# Patient Record
Sex: Female | Born: 1990 | Race: White | Hispanic: No | Marital: Single
Health system: Southern US, Community
[De-identification: ages and names within clinical notes are randomized; demographics above are authoritative.]

---
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
## 2008-04-25 IMAGING — CT CT HEAD WITHOUT CONTRAST
2 series · 16 of 30 positions shown, 20 images · non-contrast
Comparison: none

REASON FOR EXAM: HA
COMMENTS:

[Series 2: without · axial · non-contrast · 0.42mm/px · z∈[+404,+524]mm · 13 of 29 slices shown, 17 images]
[im 3/29  brain]
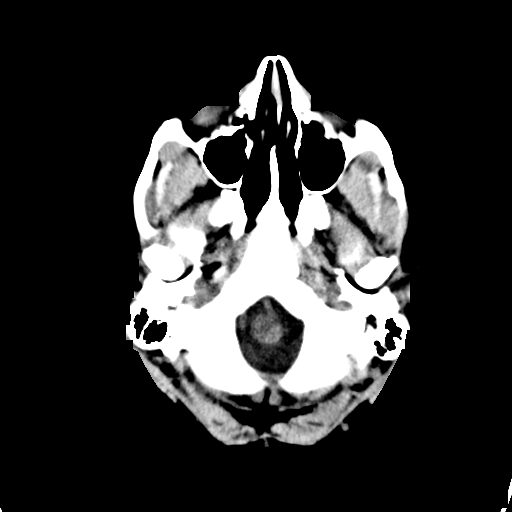
[im 3/29  bone]
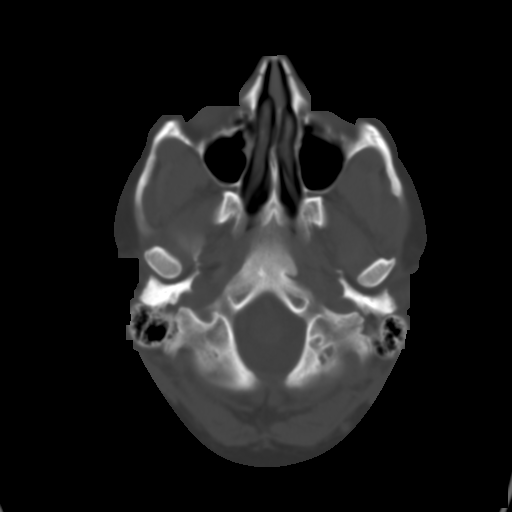
[im 5/29  brain]
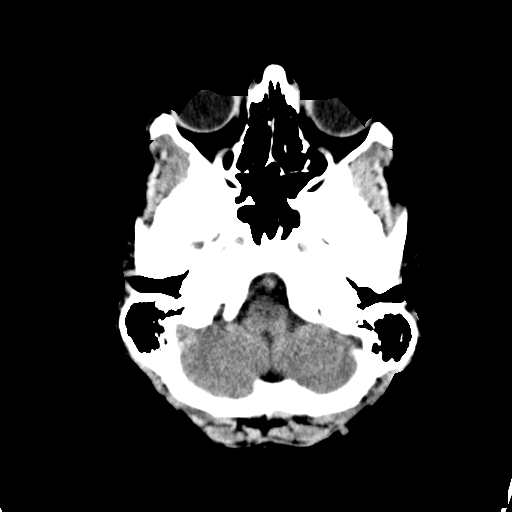
[im 7/29  brain]
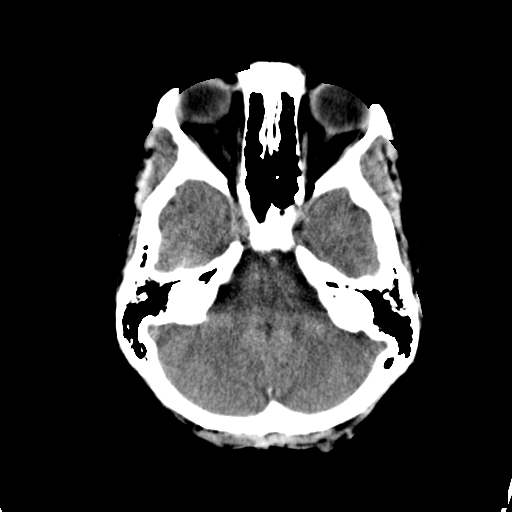
[im 9/29  brain]
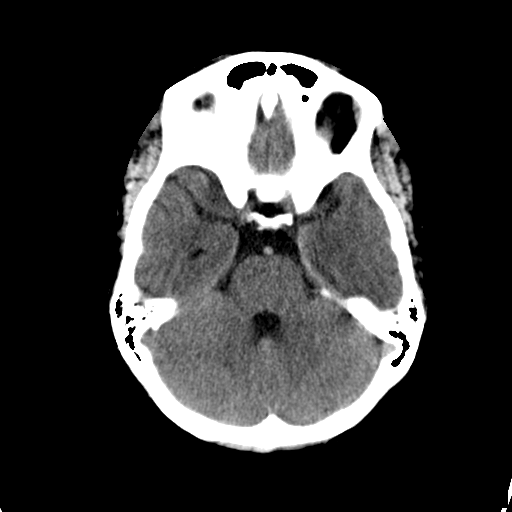
[im 11/29  brain]
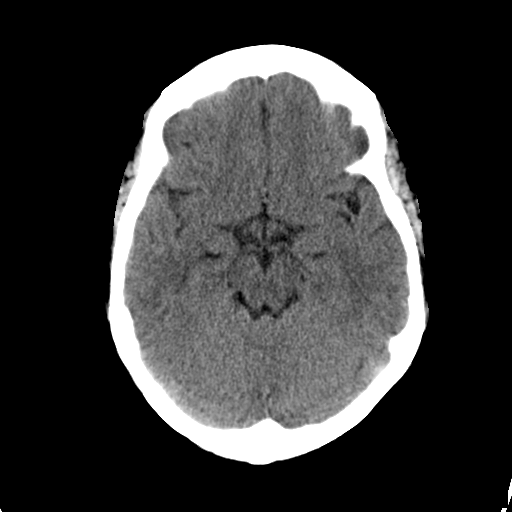
[im 11/29  bone]
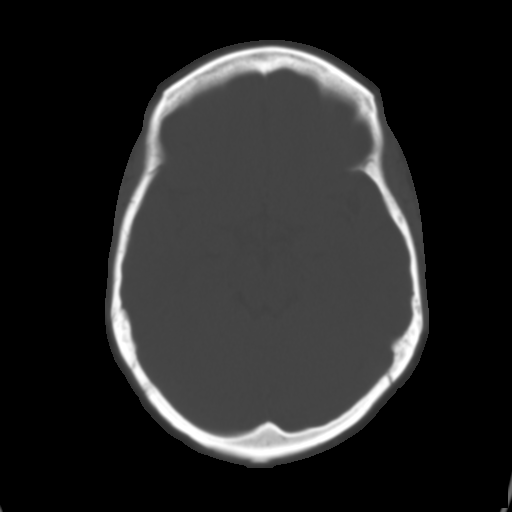
[im 13/29  brain]
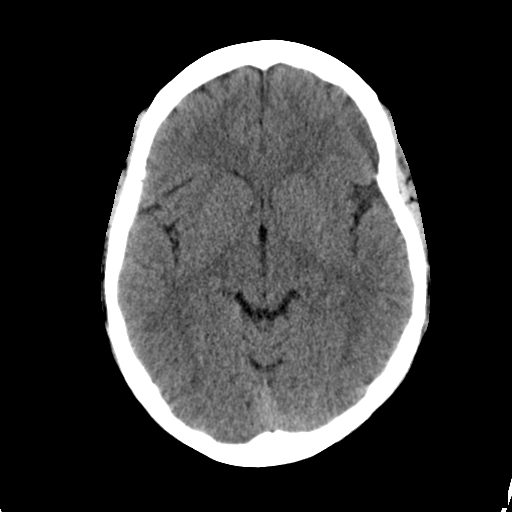
[im 15/29  brain]
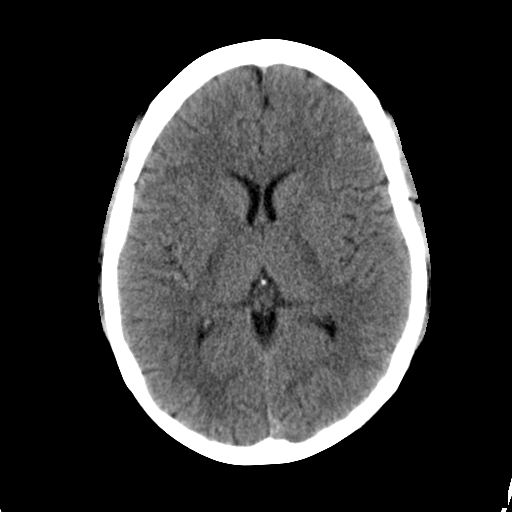
[im 17/29  brain]
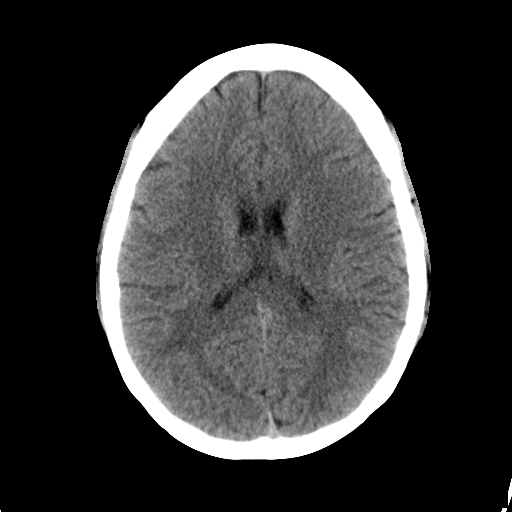
[im 19/29  brain]
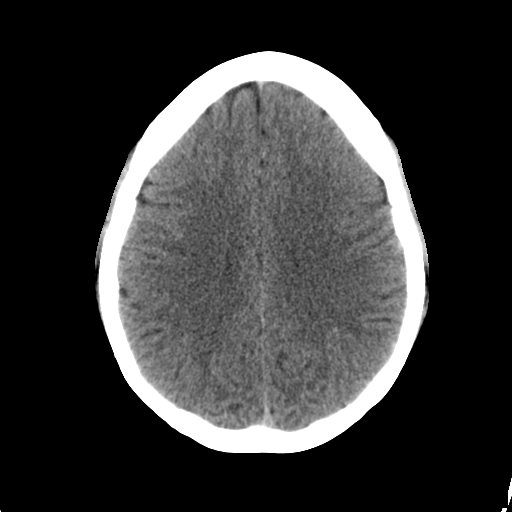
[im 19/29  bone]
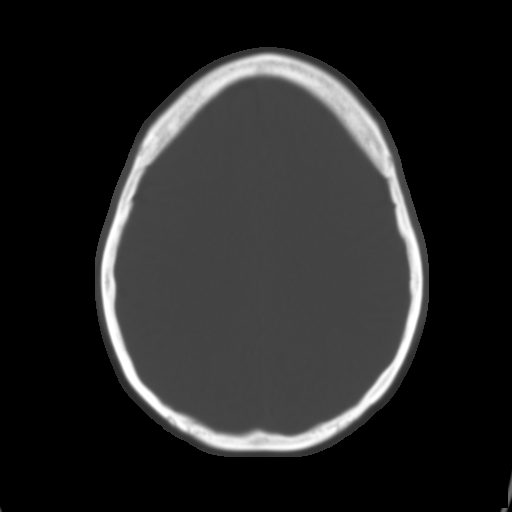
[im 21/29  brain]
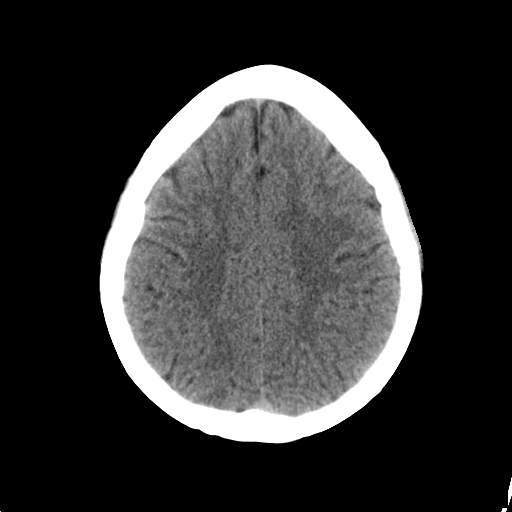
[im 23/29  brain]
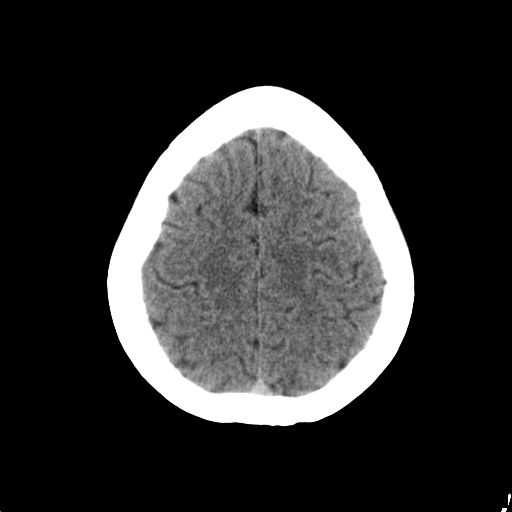
[im 25/29  brain]
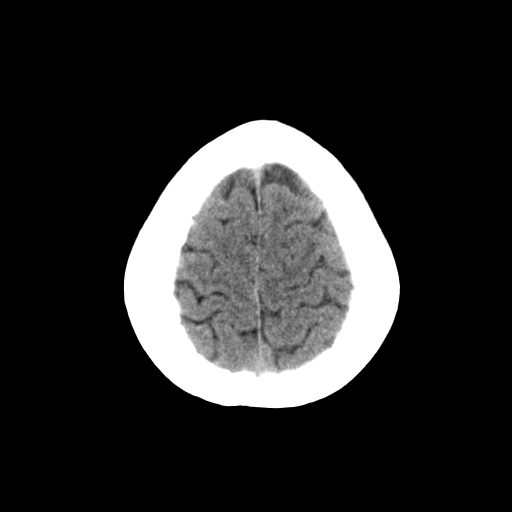
[im 27/29  brain]
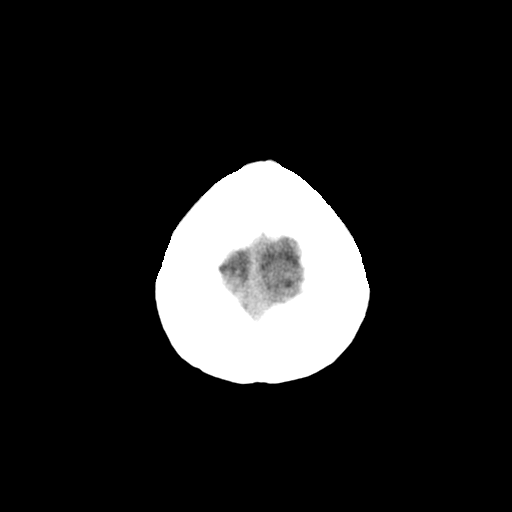
[im 27/29  bone]
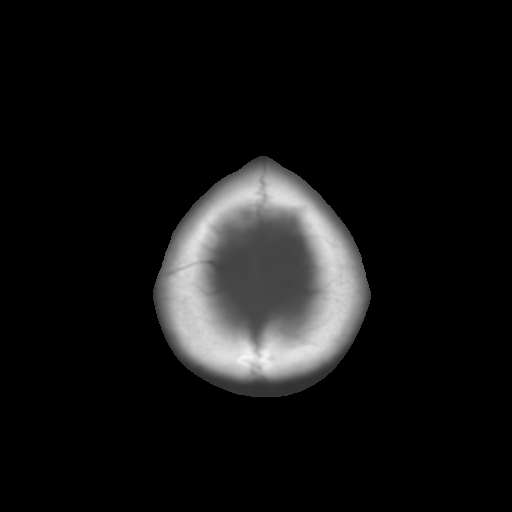

[Series 3: bone · axial · 0.42mm/px · z∈[+404,+444]mm · 3 of 29 slices shown]
[im 3/29  bone]
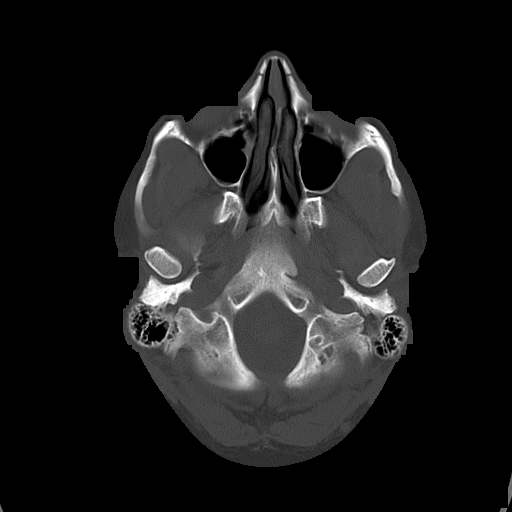
[im 7/29  bone]
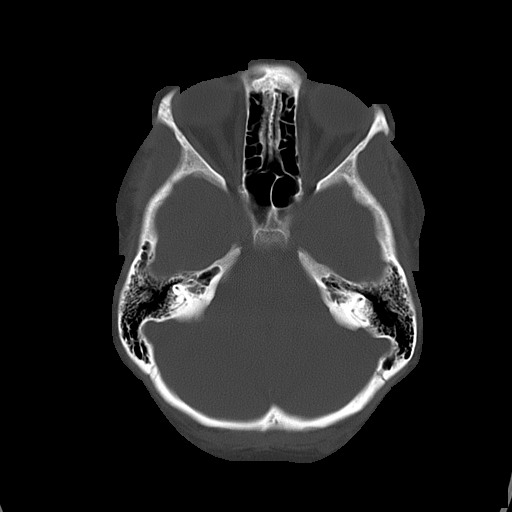
[im 11/29  bone]
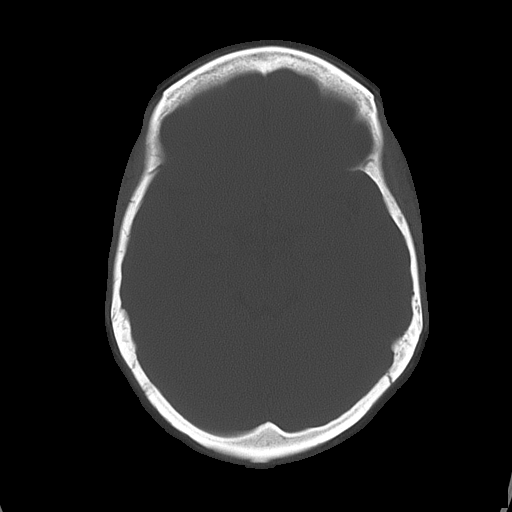

[16 of 30 positions shown; findings below may reference images not displayed]

PROCEDURE:     CT  - CT HEAD WITHOUT CONTRAST  - August 21, 2007  [DATE]

RESULT:     The ventricles are normal in size and position. There is no
intracranial hemorrhage, mass, or mass effect. At bone window settings, the
observed portions of the paranasal sinuses are clear. No lytic or blastic
bony lesions are identified.
IMPRESSION: I do not see evidence of an acute intracranial abnormality.

Further evaluation with MRI is available if the patient's symptoms warrant
this.

A preliminary report was sent to the [HOSPITAL] the conclusion
of the study.

## 2011-05-10 NOTE — Group Therapy Note (Signed)
[name] [name], [name]               ACCOUNT NO.:  [id number]   MEDICAL RECORD NO.:  [id number]          PATIENT TYPE:  WOC   LOCATION:  [facility]                   FACILITY:  [facility]   PHYSICIAN:  Tinnie Gens, MD        DATE OF BIRTH:  09/18/1991   DATE OF SERVICE:  10/17/2007                                  CLINIC NOTE   CHIEF COMPLAINT:  Genital warts.   HISTORY OF PRESENT ILLNESS:  This patient is an 20 year old gravida 2,  para 1-0-1-1 who has a history of genital warts.  She was previously  seen at [facility] in [place], she was treated with TCA  back in February of this year for several mom treatments however, she  failed to get significant resolution.  The patient was then scheduled  for laser removal of her condyloma in the OR, however at her preop visit  she was noted to have dangerous the low platelets.  At that time she was  diagnosed with ITP, given rounds of steroids and referred to  Hematology/Oncology.  Apparently she failed to keep her appointment at  [facility] for this problem and now she comes in  today for further treatment.  The patient reports she did some  regression of her warts with TCA treatment, just not enough.  The  patient has never tried Aldara cream.  The patient reports a Pap smear a  few months ago at the [facility], however I do not have records of  this.  The patient denies ever receiving Gardasil.   PAST MEDICAL HISTORY:  Significant for ITP.   PAST SURGICAL HISTORY:  Negative.   OBSTETRICAL HISTORY:  G2, P1, one vaginal delivery.   GYN HISTORY:  Menarche at age 46, cycles monthly every 3-4 weeks.  She  is not on any birth control.   FAMILY HISTORY:  Negative.   SOCIAL HISTORY:  She does smoke one pack per day for the last 3 years.  No IV drug use or alcohol use.   A 14-point review of systems is reviewed, is negative.   EXAM:  Vitals are as in the chart.  She is a well-developed, well-  nourished  female in no acute distress.  ABDOMEN:  Soft, nontender, nondistended.  GU:  The external genitalia are peppered with condyloma from the mons  pubis to the perianal region, both sides are effected.  There are at  least more than 20.  There are several different locations.  I do not  think they would be very amenable to TCA nor laser, suspect this would  be better treated with Aldara cream.  BUS is normal.  Vagina is pink and  rugated.  Cervix parous without lesion.  Uterus small, anteverted.  No  adnexal mass or tenderness.   IMPRESSION:  1. Condyloma acuminata.  2. History of idiopathic thrombocytopenic purpura, question resolved.      Her last blood count was 280 however I am not sure if she was on      steroids at that time.  3. High-risk for sexually transmitted diseases.  4. Birth control  counseling.   PLAN:  1. Will get the patient some birth control pills.  2. Aldara to apply to affected area 3 times weekly, wash off in the      morning for at least 6 weeks.  3. Gardasil vaccine today.  4. Followup in 6 weeks for a repeat Gardasil and recheck of her warts      and see how she is doing with the medication.           ______________________________  Tinnie Gens, MD     TP/MEDQ  D:  10/17/2007  T:  10/18/2007  Job:  [id number]

## 2011-10-05 LAB — POCT PREGNANCY, URINE: Operator id: 148111

## 2017-01-30 ENCOUNTER — Encounter: Payer: Self-pay | Admitting: Emergency Medicine

## 2017-01-30 ENCOUNTER — Emergency Department
Admission: EM | Admit: 2017-01-30 | Discharge: 2017-01-30 | Disposition: A | Discharge status: Home / Self Care | Payer: Self-pay | Attending: Emergency Medicine | Admitting: Emergency Medicine

## 2017-01-30 DIAGNOSIS — J069 Acute upper respiratory infection, unspecified: Secondary | ICD-10-CM | POA: Insufficient documentation

## 2017-01-30 MED ORDER — PSEUDOEPH-BROMPHEN-DM 30-2-10 MG/5ML PO SYRP: 5.0000 mL | ORAL_SOLUTION | Freq: Four times a day (QID) | ORAL | 0 refills | Status: AC | PRN

## 2017-01-30 NOTE — ED Triage Notes (Signed)
Pt reports cough and runny nose for three to four days. Pt states she needs a note to be able to return to work.

## 2017-01-30 NOTE — ED Notes (Signed)
See triage note  Has had cough with runny nose for about 4 days   Afebrile on arrival to ED

## 2017-01-30 NOTE — ED Provider Notes (Signed)
[facility] Emergency Department Provider Note   ____________________________________________   First MD Initiated Contact with Patient 01/30/17 1623     (approximate)  I have reviewed the triage vital signs and the nursing notes.   HISTORY  Chief Complaint Cough    HPI [name] [name] is a 26 y.o. female patient complaining of cough and runny nose 3-4 days. Patient state her job requires her to have a note return back to work. 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 Patient said her cough intermitting productive and nonproductive. States cough at this time is productive. Patient denies any fever associated this complaint. Patient has not taken a flu shot this season. Patient denies generalized body aches, nausea, vomiting, or diarrhea. No palliative measures taken for her complaint. Patient is afebrile.  History reviewed. No pertinent past medical history.  There are no active problems to display for this patient.   No past surgical history on file.  Prior to Admission medications   Medication Sig Start Date End Date Taking? Authorizing Provider  brompheniramine-pseudoephedrine-DM 30-2-10 MG/5ML syrup Take 5 mLs by mouth 4 (four) times daily as needed. 01/30/17   Joni Reining, PA-C    Allergies Patient has no known allergies.  No family history on file.  Social History Social History  Substance Use Topics  . Smoking status: Not on file  . Smokeless tobacco: Not on file  . Alcohol use Not on file    Review of Systems Constitutional: No fever/chills Eyes: No visual changes. RUE:AVWUJ congestion intermittently runny nose.  Cardiovascular: Denies chest pain. Respiratory: Denies shortness of breath. Dr. cough Gastrointestinal: No abdominal pain.  No nausea, no vomiting.  No diarrhea.  No constipation. Genitourinary: Negative for dysuria. Musculoskeletal: Negative for back pain. Skin: Negative for rash. Neurological: Negative for  headaches, focal weakness or numbness.   ____________________________________________   PHYSICAL EXAM:  VITAL SIGNS: ED Triage Vitals  Enc Vitals Group     BP 01/30/17 1554 113/63     Pulse Rate 01/30/17 1554 93     Resp 01/30/17 1554 18     Temp 01/30/17 1554 98.6 F (37 C)     Temp Source 01/30/17 1554 Oral     SpO2 01/30/17 1554 99 %     Weight 01/30/17 1556 170 lb (77.1 kg)     Height 01/30/17 1556 5' (1.524 m)     Head Circumference --      Peak Flow --      Pain Score --      Pain Loc --      Pain Edu? --      Excl. in GC? --     Constitutional: Alert and oriented. Well appearing and in no acute distress. Eyes: Conjunctivae are normal. PERRL. EOMI. Head: Atraumatic. Nose:Edematous nasal turbinates clear rhinorrhea. Mouth/Throat: Mucous membranes are moist.  Oropharynx non-erythematous. Neck: No stridor.  No cervical spine tenderness to palpation. Hematological/Lymphatic/Immunilogical: No cervical lymphadenopathy. Cardiovascular: Normal rate, regular rhythm. Grossly normal heart sounds.  Good peripheral circulation. Respiratory: Normal respiratory effort.  No retractions. Lungs CTAB. Productive cough. Gastrointestinal: Soft and nontender. No distention. No abdominal bruits. No CVA tenderness. Musculoskeletal: No lower extremity tenderness nor edema.  No joint effusions. Neurologic:  Normal speech and language. No gross focal neurologic deficits are appreciated. No gait instability. Skin:  Skin is warm, dry and intact. No rash noted. Psychiatric: Mood and affect are normal. Speech and behavior are normal.  ____________________________________________   LABS (all labs ordered are  listed, but only abnormal results are displayed)  Labs Reviewed - No data to display ____________________________________________  EKG   ____________________________________________  RADIOLOGY   ____________________________________________   PROCEDURES  Procedure(s)  performed: None  Procedures  Critical Care performed: No  ____________________________________________   INITIAL IMPRESSION / ASSESSMENT AND PLAN / ED COURSE  Pertinent labs & imaging results that were available during my care of the patient were reviewed by me and considered in my medical decision making (see chart for details).  Upper respiratory infection. Patient given discharge care instructions. Patient given a work note. Patient given a prescription for Bromfed-DM. Patient advised [facility].      ____________________________________________   FINAL CLINICAL IMPRESSION(S) / ED DIAGNOSES  Final diagnoses:  Upper respiratory tract infection, unspecified type      NEW MEDICATIONS STARTED DURING THIS VISIT:  New Prescriptions   BROMPHENIRAMINE-PSEUDOEPHEDRINE-DM 30-2-10 MG/5ML SYRUP    Take 5 mLs by mouth 4 (four) times daily as needed.     Note:  This document was prepared using Dragon voice recognition software and may include unintentional dictation errors.    Joni Reiningonald K Dilraj Killgore, PA-C 01/30/17 1638    Emily FilbertJonathan E Williams, MD 01/31/17 [phone]

## 2017-03-20 ENCOUNTER — Ambulatory Visit: Payer: Self-pay

## 2017-04-10 ENCOUNTER — Ambulatory Visit: Payer: Self-pay | Admitting: Pharmacy Technician

## 2017-04-10 NOTE — Progress Notes (Signed)
Patient scheduled for eligibility appointment at [facility].  Patient did not show for the appointment on April 10, 2017 at 2:00p.m.  Patient did not reschedule eligibility appointment.  [facility] unable to provide additional medication assistance until eligibility is determined.  Sherilyn Dacosta Care Manager [facility]

## 2022-11-25 DIAGNOSIS — Z419 Encounter for procedure for purposes other than remedying health state, unspecified: Secondary | ICD-10-CM | POA: Diagnosis not present

## 2022-12-26 DIAGNOSIS — Z419 Encounter for procedure for purposes other than remedying health state, unspecified: Secondary | ICD-10-CM | POA: Diagnosis not present

## 2023-01-26 DIAGNOSIS — Z419 Encounter for procedure for purposes other than remedying health state, unspecified: Secondary | ICD-10-CM | POA: Diagnosis not present

## 2023-02-24 DIAGNOSIS — Z419 Encounter for procedure for purposes other than remedying health state, unspecified: Secondary | ICD-10-CM | POA: Diagnosis not present

## 2023-03-27 DIAGNOSIS — Z419 Encounter for procedure for purposes other than remedying health state, unspecified: Secondary | ICD-10-CM | POA: Diagnosis not present

## 2023-04-26 DIAGNOSIS — Z419 Encounter for procedure for purposes other than remedying health state, unspecified: Secondary | ICD-10-CM | POA: Diagnosis not present

## 2023-05-27 DIAGNOSIS — Z419 Encounter for procedure for purposes other than remedying health state, unspecified: Secondary | ICD-10-CM | POA: Diagnosis not present

## 2023-06-26 DIAGNOSIS — Z419 Encounter for procedure for purposes other than remedying health state, unspecified: Secondary | ICD-10-CM | POA: Diagnosis not present

## 2023-07-27 DIAGNOSIS — Z419 Encounter for procedure for purposes other than remedying health state, unspecified: Secondary | ICD-10-CM | POA: Diagnosis not present

## 2023-08-27 DIAGNOSIS — Z419 Encounter for procedure for purposes other than remedying health state, unspecified: Secondary | ICD-10-CM | POA: Diagnosis not present

## 2023-09-26 DIAGNOSIS — Z419 Encounter for procedure for purposes other than remedying health state, unspecified: Secondary | ICD-10-CM | POA: Diagnosis not present

## 2023-10-27 DIAGNOSIS — Z419 Encounter for procedure for purposes other than remedying health state, unspecified: Secondary | ICD-10-CM | POA: Diagnosis not present

## 2023-11-26 DIAGNOSIS — Z419 Encounter for procedure for purposes other than remedying health state, unspecified: Secondary | ICD-10-CM | POA: Diagnosis not present

## 2023-12-27 DIAGNOSIS — Z419 Encounter for procedure for purposes other than remedying health state, unspecified: Secondary | ICD-10-CM | POA: Diagnosis not present

## 2024-01-27 DIAGNOSIS — Z419 Encounter for procedure for purposes other than remedying health state, unspecified: Secondary | ICD-10-CM | POA: Diagnosis not present

## 2024-02-24 DIAGNOSIS — Z419 Encounter for procedure for purposes other than remedying health state, unspecified: Secondary | ICD-10-CM | POA: Diagnosis not present

## 2024-04-06 DIAGNOSIS — Z419 Encounter for procedure for purposes other than remedying health state, unspecified: Secondary | ICD-10-CM | POA: Diagnosis not present

## 2024-05-06 DIAGNOSIS — Z419 Encounter for procedure for purposes other than remedying health state, unspecified: Secondary | ICD-10-CM | POA: Diagnosis not present

## 2024-06-06 DIAGNOSIS — Z419 Encounter for procedure for purposes other than remedying health state, unspecified: Secondary | ICD-10-CM | POA: Diagnosis not present

## 2024-07-06 DIAGNOSIS — Z419 Encounter for procedure for purposes other than remedying health state, unspecified: Secondary | ICD-10-CM | POA: Diagnosis not present

## 2024-08-06 DIAGNOSIS — Z419 Encounter for procedure for purposes other than remedying health state, unspecified: Secondary | ICD-10-CM | POA: Diagnosis not present

## 2024-09-06 DIAGNOSIS — Z419 Encounter for procedure for purposes other than remedying health state, unspecified: Secondary | ICD-10-CM | POA: Diagnosis not present

## 2024-11-06 DIAGNOSIS — Z419 Encounter for procedure for purposes other than remedying health state, unspecified: Secondary | ICD-10-CM | POA: Diagnosis not present
